# Patient Record
Sex: Female | Born: 1980 | Race: White | Hispanic: Yes | Marital: Married | Smoking: Never smoker
Health system: Southern US, Community
[De-identification: ages and names within clinical notes are randomized; demographics above are authoritative.]

## PROBLEM LIST (undated history)

## (undated) HISTORY — PX: CHOLECYSTECTOMY: SHX55

---

## 2003-05-06 ENCOUNTER — Encounter: Admission: RE | Admit: 2003-05-06 | Discharge: 2003-05-06 | Payer: Self-pay | Admitting: Obstetrics and Gynecology

## 2003-05-20 ENCOUNTER — Encounter: Admission: RE | Admit: 2003-05-20 | Discharge: 2003-05-20 | Payer: Self-pay | Admitting: Obstetrics and Gynecology

## 2003-12-22 ENCOUNTER — Inpatient Hospital Stay (HOSPITAL_COMMUNITY): Admission: AD | Admit: 2003-12-22 | Discharge: 2003-12-23 | Payer: Self-pay | Admitting: Family Medicine

## 2006-03-09 ENCOUNTER — Emergency Department (HOSPITAL_COMMUNITY): Admission: EM | Admit: 2006-03-09 | Discharge: 2006-03-10 | Payer: Self-pay | Admitting: Emergency Medicine

## 2007-03-12 ENCOUNTER — Ambulatory Visit (HOSPITAL_COMMUNITY): Admission: RE | Admit: 2007-03-12 | Discharge: 2007-03-12 | Payer: Self-pay | Admitting: Gynecology

## 2007-04-11 ENCOUNTER — Emergency Department (HOSPITAL_COMMUNITY): Admission: EM | Admit: 2007-04-11 | Discharge: 2007-04-11 | Payer: Self-pay | Admitting: Emergency Medicine

## 2007-05-22 ENCOUNTER — Ambulatory Visit (HOSPITAL_COMMUNITY): Admission: AD | Admit: 2007-05-22 | Discharge: 2007-05-25 | Payer: Self-pay | Admitting: Surgery

## 2007-05-22 ENCOUNTER — Encounter (INDEPENDENT_AMBULATORY_CARE_PROVIDER_SITE_OTHER): Payer: Self-pay | Admitting: Surgery

## 2007-05-28 ENCOUNTER — Ambulatory Visit: Payer: Self-pay | Admitting: Internal Medicine

## 2008-02-18 IMAGING — RF DG ERCP WO/W SPHINCTEROTOMY
1 series · 2 of 2 positions shown · non-contrast
Comparison: none

CLINICAL DATA: Gallstones

ERCP:

[Series 1: run · 2 of 2 slices shown]
[im 1/2]
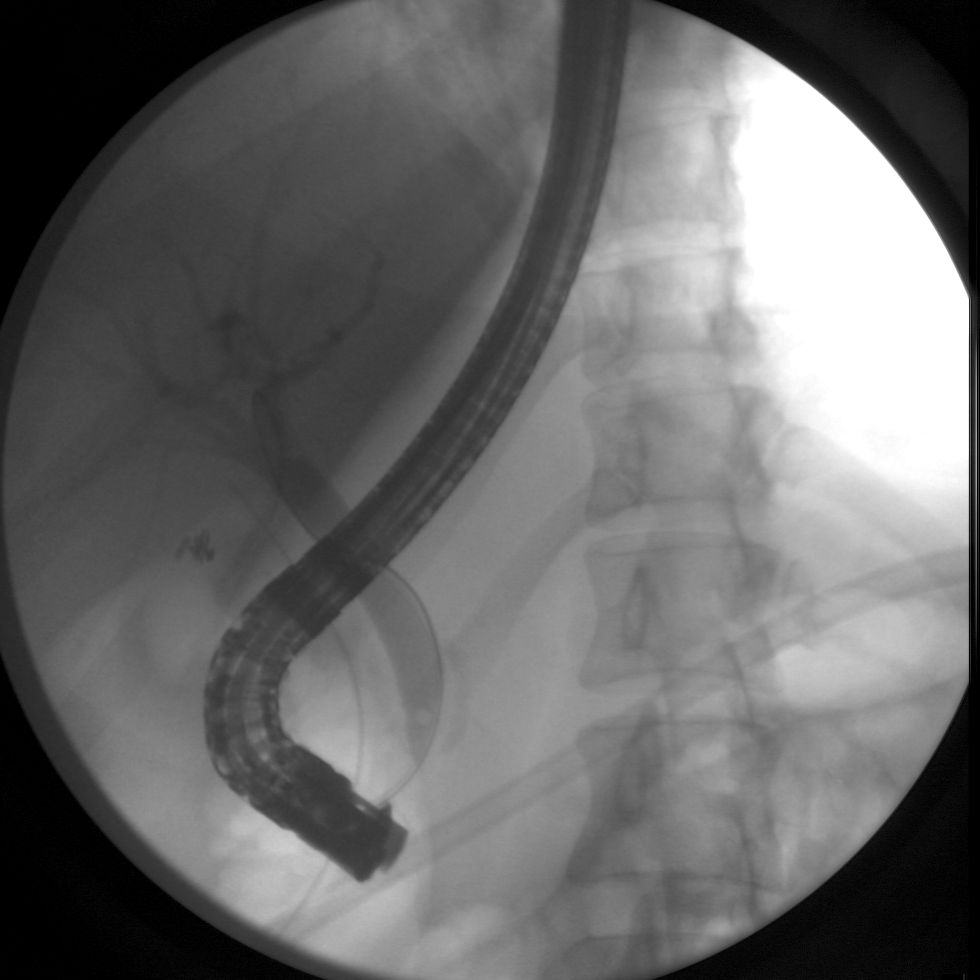
[im 2/2]
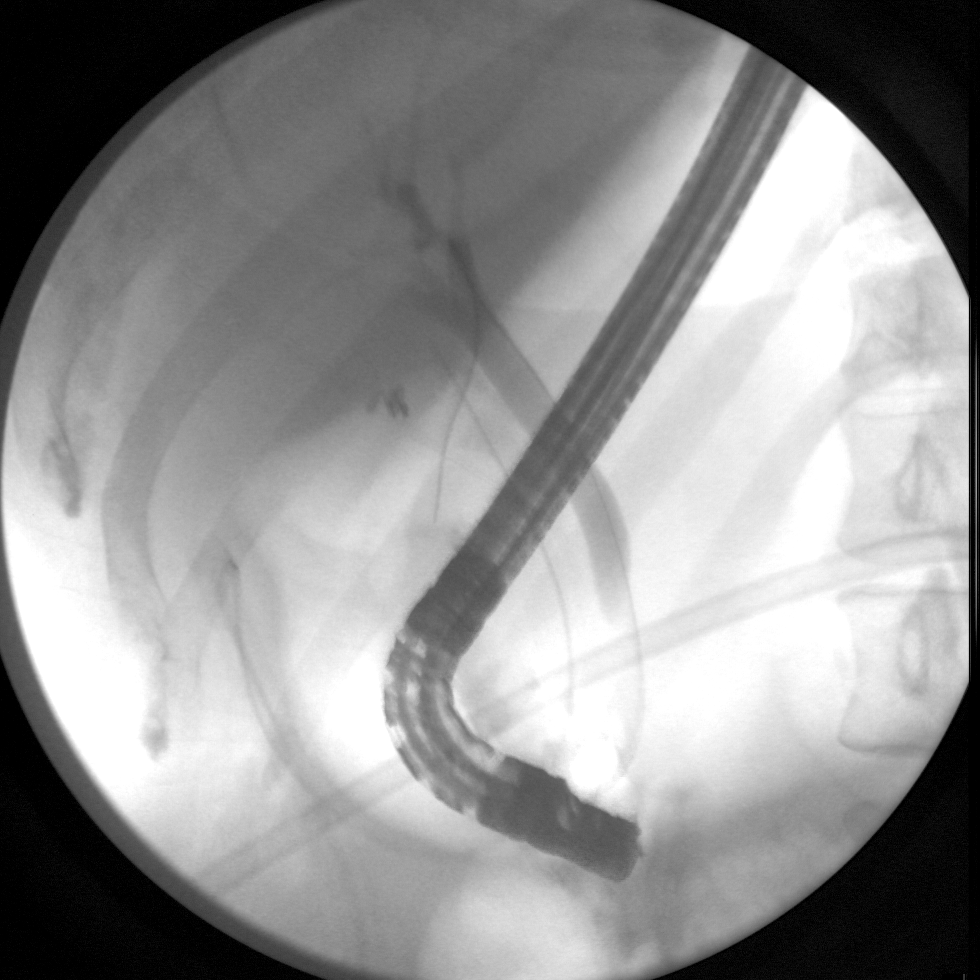

[2 of 2 positions shown; findings below may reference images not displayed]

FINDINGS: ERCP was performed without radiologist present. Two spot images are
submitted, showing opacified mildly dilated biliary system. On the first image,
there is a small filling defect in the distal common bile duct which may
represent retained stone or air bubble. Final image shows no definite filling
defect in the opacified portions of the biliary system.
IMPRESSION: ERCP as above.

## 2008-03-06 ENCOUNTER — Ambulatory Visit: Payer: Self-pay | Admitting: Gynecology

## 2008-03-14 ENCOUNTER — Ambulatory Visit: Payer: Self-pay | Admitting: Gynecology

## 2008-03-16 ENCOUNTER — Ambulatory Visit: Payer: Self-pay | Admitting: Gynecology

## 2008-03-31 ENCOUNTER — Ambulatory Visit: Payer: Self-pay | Admitting: Gynecology

## 2008-04-02 ENCOUNTER — Ambulatory Visit: Payer: Self-pay | Admitting: Gynecology

## 2008-04-08 ENCOUNTER — Ambulatory Visit: Payer: Self-pay | Admitting: Gynecology

## 2008-04-16 ENCOUNTER — Ambulatory Visit: Payer: Self-pay | Admitting: Gynecology

## 2008-04-24 ENCOUNTER — Inpatient Hospital Stay (HOSPITAL_COMMUNITY): Admission: AD | Admit: 2008-04-24 | Discharge: 2008-04-24 | Payer: Self-pay | Admitting: Obstetrics & Gynecology

## 2010-10-05 NOTE — Discharge Summary (Signed)
Natalie Landry, RAMBEAU                ACCOUNT NO.:  [id number]   MEDICAL RECORD NO.:  [id number]          PATIENT TYPE:  INP   LOCATION:  5152                         FACILITY:  [facility]   PHYSICIAN:  Thomas A. Cornett, M.D.DATE OF BIRTH:  1981/04/16   DATE OF ADMISSION:  05/22/2007  DATE OF DISCHARGE:  05/25/2007                               DISCHARGE SUMMARY   ADMITTING DIAGNOSES:  1. Symptomatic cholelithiasis.  2. Common bile duct stone.   DISCHARGE DIAGNOSES:  1. Symptomatic cholelithiasis.  2. Common bile duct stone.   PROCEDURES PERFORMED:  1. Laparoscopic cholecystectomy with intraoperative cholangiogram.  2. ERCP by Dr. Juanda Chance of [facility].   BRIEF HISTORY:  The patient is a 30 year old Hispanic female with  symptomatic cholelithiasis.  She is admitted on 05/22/2007 after  laparoscopic cholecystectomy due to common bile duct stones on  intraoperative cholangiogram.   HOSPITAL COURSE:  After undergoing laparoscopic cholecystectomy with  intraoperative cholangiogram on 05/22/2007, GI was consulted which  consisted of [facility] by Dr. Lina Sar.  The patient was seen and  ERCP was performed on 05/23/2007 with clearance of her duct due to  common duct stones.  Her liver function studies were elevated, but these  came down.  She felt much better after undergoing ERCP.  She had a drain  in place and this was removed on 05/25/2007.  The patient was doing well  at that point.  She was having some urinary frequency and I placed her  prophylactically on Cipro 500 mg p.o. b.i.d. for three days.  She will  be discharged home in an improved condition.  Her JP drain was removed  prior to discharge.   CONDITION AT DISCHARGE:  Improved.   DISCHARGE INSTRUCTIONS:  She will return in 3-4 weeks.  She may resume  work in 1-2 weeks, may shower at any time.  Her diet will be as  tolerated.  She will take Cipro 500 mg p.o. b.i.d. for three days.  She  will take Vicodin-ES 1-2 tabs every  four hours p.r.n. pain.      Thomas A. Cornett, M.D.  Electronically Signed     TAC/MEDQ  D:  05/25/2007  T:  05/25/2007  Job:  [id number]

## 2010-10-05 NOTE — Op Note (Signed)
Natalie Landry, Natalie Landry                ACCOUNT NO.:  [id number]   MEDICAL RECORD NO.:  [id number]          PATIENT TYPE:  OIB   LOCATION:  5152                         FACILITY:  [facility]   PHYSICIAN:  Thomas A. Cornett, M.D.DATE OF BIRTH:  02-May-1981   DATE OF PROCEDURE:  05/22/2007  DATE OF DISCHARGE:                               OPERATIVE REPORT   PREOPERATIVE DIAGNOSIS:  Symptomatic cholelithiasis.   POSTOPERATIVE DIAGNOSIS:  Symptomatic cholelithiasis plus  choledocholithiasis.   PROCEDURE:  Laparoscopic cholecystectomy with intraoperative  cholangiogram.   SURGEON:  Thomas A. Cornett, M.D.   ASSISTANT:  OR staff.   DRAINS:  19 Blake drain to gallbladder fossa.   SPECIMEN:  Gallbladder stone to pathology.   ESTIMATED BLOOD LOSS:  20 mL.   INDICATIONS FOR PROCEDURE:  The patient is a 30 year old Hispanic female  with cholelithiasis.  She presents for laparoscopic cholecystectomy  today due to symptomatic cholelithiasis.  Informed consent was obtained  via translator.   DESCRIPTION OF PROCEDURE:  The patient was brought to the operating room  and placed supine.  After induction of general endotracheal anesthesia,  the abdomen was prepped and draped in sterile fashion.   A 1-cm infraumbilical incision was made, and dissection was carried down  to her midline fascia, and her midline fascia was opened with the  scalpel.  Kochers were used to grasp the fascia, and I used a small  hemostat to spread open the peritoneal lining.  A pursestring suture of  0 Vicryl was placed, and a 12-mm Hassan cannula was placed under direct  vision.  Pneumoperitoneum was created with 15 mmHg of CO2, and the  laparoscope was then placed.  She was placed in reverse Trendelenburg  and rolled to her left.  Next, laparoscopy was performed.  I saw no  other significant intra-abdominal abnormality.  An 11-mm subxiphoid port  was placed under direct vision.  Two 5-mm ports were placed in the right  upper abdominal quadrant.  The dome of the gallbladder was grasped with  a grasper and pushed toward the patient's right shoulder.  A second  grasper was used to grab the infundibulum and pull it toward the  patient's right lower quadrant.  We used a dissection to dissect out the  infundibulum, and then we were able to expose the cystic duct as it  entered the gallbladder.  A clip was placed on the gallbladder side of  the cystic duct.  A small incision was made in the cystic duct.  Through  a separate stab incision, a Cook cholangiogram catheter was introduced,  and this was placed in the cystic duct.  Clips were then placed to hold  the catheter in the cystic duct.  Intraoperative cholangiogram was then  performed using one-half strength Hypaque dye.  She had a very tortuous  cystic duct that flowed into the common duct quite quickly.  There was  free flow of contrast up the common hepatic duct to the bifurcation.  Next, there was free flow of contrast down the common bile duct that  stopped at the ampulla, and there appeared to  be a meniscus at the  distal common duct.  1 mg of glucagon was given, but unfortunately the  common duct would not empty.  This appeared to be a common bile duct  stone, and I felt postop ERCP would be warranted.  At this point in  time, I removed the catheter.  We triple clipped the cystic duct and  divided it.  We found the cystic artery and controlled it between clips  and divided it.  We then used the cautery to dissect the gallbladder  from the gallbladder fossa.  One small hole was placed in the  gallbladder with spillage of bile, but no stones.  We were able to get  the gallbladder out of the gallbladder fossa and place it in an  EndoCatch bag.  We inspected the gallbladder bed.  There were two small  points where there was some oozing, and cautery was used on these.  We  then irrigated out any excess irrigation and bile.  I placed a 19 Blake  drain in  the gallbladder fossa through the most lateral port site, given  the fact that she has a common duct stone and if there was any leakage,  this would help.  At this point in time, I secured it with 3-0 nylon.  Excess irrigation was suctioned out.  We then removed the gallbladder  through the umbilical port and passed it off the field.  The umbilical  port was closed with a pursestring suture of Vicryl already in place.  The remainder of her ports were removed.  CO2 was allowed to escape  simultaneously.  We closed the skin incisions with 4-0 Monocryl.  Dermabond was applied.  All final counts of sponge, needle, and  instruments were found to be correct at this portion of the case.   The patient was then awakened and taken to recovery in satisfactory  condition.      Thomas A. Cornett, M.D.  Electronically Signed     TAC/MEDQ  D:  05/22/2007  T:  05/22/2007  Job:  [id number]

## 2010-10-08 NOTE — Group Therapy Note (Signed)
NAME:  Natalie, Landry                     ACCOUNT NO.:  [id number]   MEDICAL RECORD NO.:  [id number]                   PATIENT TYPE:  OUT   LOCATION:  [facility]                           FACILITY:  [facility]   PHYSICIAN:  Elsie Lincoln, MD                   DATE OF BIRTH:  1980-06-02   DATE OF SERVICE:  05/20/2003                                    CLINIC NOTE   CHIEF COMPLAINT:  Natalie Landry is here for follow-up of testing results for  oligomenorrhea and infertility.  The patient also is in need of a Pap smear  today.  She was here two weeks ago; however, she was having her  menstruation.  Test results were all negative.  Free testosterone was  negative, DHEAS was negative, 17-hydroxyprogesterone was negative.  She had  a two-hour GCT done.  Her fasting glucose was 99 which is upper limit of  normal.  The two-hour glucose is 106 and her fasting insulin was 5.  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   PHYSICAL EXAMINATION:  GENERAL:  Acne scars and acanthosis nigricans of her  neck and small increased female hair pattern.  PELVIC:  External genitalia:  Tanner V.  Vagina:  Pink, normal rugae.  At  this point the patient complains she has been having some discharge so wet  prep was done.  Mildly increased erythema, no odor with the discharge.  Uterus:  Small, anteverted, nontender.  Adnexa:  No masses, nontender.   ASSESSMENT AND PLAN:  A 30 year old female with irregular menses and  infertility.   1. Pap smear and cultures done today.  2. Wet prep sent for vaginitis.  3. The patient given results of sperm analysis and told that the patient's     husband needs to see a urologist to have further evaluation to see if     there is an anatomical, chromosomal, or other reason there is no sperm     present.  4. TSH and prolactin today.  5. Return to clinic in three weeks to discuss results.  The patient     questionably PSO given appearance, irregular menstruation, and  increased     fasting glucose.  May consider Glucophage next visit to see if she can     start menstruating.  If the patient does not desire this may consider     OCPs to give her regular menses.                                               Elsie Lincoln, MD    KL/MEDQ  D:  05/20/2003  T:  05/20/2003  Job:  [id number]

## 2010-10-08 NOTE — Group Therapy Note (Signed)
Natalie Landry, LEOPARD                     ACCOUNT NO.:  [id number]   MEDICAL RECORD NO.:  [id number]                   PATIENT TYPE:  OUT   LOCATION:  [facility]                           FACILITY:  [facility]   PHYSICIAN:  Elsie Lincoln, MD                   DATE OF BIRTH:  01-13-1981   DATE OF SERVICE:  05/06/2003                                    CLINIC NOTE   HISTORY OF PRESENT ILLNESS:  The patient is a 30 year old G0, P0 who  presents for Pap smear and for discussion of irregular menstrual periods.  She has been married for four years and has been trying to get pregnant and  has been unsuccessful.  Unable to time period secondary to irregular menses.  The patient has had irregular menses since she started menarche at age 73.  Sometimes they are every one to three months.  The patient does not know of  any children her husband has fathered.  The patient also desires Pap smear  today; however, she is menstruating.  Her LMP was May 01, 2003 and she  is bleeding too heavily to do this today.   GYN HISTORY:  No sexually transmitted diseases or abnormal Pap smears,  fibroid tumors of the uterus, or ovarian cysts.   PAST MEDICAL HISTORY:  Denies.   PAST SURGICAL HISTORY:  Had umbilical hernia repair as an infant.   ALLERGIES:  None.   MEDICATIONS:  None.   REVIEW OF SYSTEMS:  No chest pain, shortness of breath, nausea, vomiting,  diarrhea, change in bowel or bladder habits.   PHYSICAL EXAMINATION:  SKIN:  It looks like there is acanthosis nigricans  along the neck.  Evidence of old acne scars on her face and mild hirsutism  on the chin, upper lip, and periumbilical area.  PELVIC:  External genitalia is Tanner 5.  Vagina:  Blood in the vault.  Unable to do Pap smear.   ASSESSMENT/PLAN:  A 30 year old G0 female with oligomenorrhea and needing a  Pap smear.  1. Pap smear in two weeks.  2. Will test for PCO.  This includes two hour glucose challenge test with a     fasting  insulin, fasting glucose, and a two hour glucose.  3. DHEAS, free testosterone, and 17-hydroxyprogesterone.  4. Return to clinic in two weeks for test results.  5. The patient to get sperm analysis.                                               Elsie Lincoln, MD   KL/MEDQ  D:  05/06/2003  T:  05/06/2003  Job:  [id number]

## 2011-02-10 LAB — COMPREHENSIVE METABOLIC PANEL WITH GFR
ALT: 436 — ABNORMAL HIGH
AST: 209 — ABNORMAL HIGH
Albumin: 3 — ABNORMAL LOW
Alkaline Phosphatase: 135 — ABNORMAL HIGH
BUN: 3 — ABNORMAL LOW
CO2: 31
Calcium: 8.8
Chloride: 105
Creatinine, Ser: 0.35 — ABNORMAL LOW
GFR calc Af Amer: >60
GFR calc non Af Amer: >60
Glucose, Bld: 115 — ABNORMAL HIGH
Potassium: 3.6
Sodium: 139
Total Bilirubin: 1.1
Total Protein: 5.7 — ABNORMAL LOW

## 2011-02-10 LAB — CBC
MCHC: 34.5
WBC: 6.5

## 2011-02-10 LAB — LIPASE, BLOOD: Lipase: 49

## 2011-02-10 LAB — AMYLASE: Amylase: 93

## 2011-02-25 LAB — COMPREHENSIVE METABOLIC PANEL
ALT: 18
AST: 19
Albumin: 4.3
Alkaline Phosphatase: 58
BUN: 7
CO2: 25
Calcium: 9
Creatinine, Ser: 0.48
GFR calc Af Amer: >60
GFR calc non Af Amer: >60
GFR calc non Af Amer: >60
Glucose, Bld: 154 — ABNORMAL HIGH
Potassium: 4

## 2011-02-25 LAB — URINALYSIS, ROUTINE W REFLEX MICROSCOPIC
Ketones, ur: 15 mg/dL — AB
Leukocytes, UA: NEGATIVE
Nitrite: NEGATIVE
Protein, ur: NEGATIVE mg/dL
Specific Gravity, Urine: >1.03 — ABNORMAL HIGH (ref 1.005–1.030)
Urobilinogen, UA: 0.2 mg/dL (ref 0.0–1.0)

## 2011-02-25 LAB — CBC
Hemoglobin: 12.1
MCHC: 33.4
MCHC: 33.8
MCV: 88.2
MCV: 88.4
Platelets: 351
RBC: 4.05
RDW: 13.4
WBC: 9.3

## 2011-02-25 LAB — HEPATIC FUNCTION PANEL
Albumin: 4.5
Alkaline Phosphatase: 122 — ABNORMAL HIGH
Bilirubin, Direct: 0.9 — ABNORMAL HIGH
Indirect Bilirubin: 0.9
Total Bilirubin: 1.8 — ABNORMAL HIGH
Total Protein: 7.8

## 2011-02-25 LAB — URINE MICROSCOPIC-ADD ON

## 2011-02-25 LAB — DIFFERENTIAL
Basophils Absolute: 0.1
Lymphocytes Relative: 32
Lymphs Abs: 3
Neutro Abs: 5.6
Neutrophils Relative %: 60

## 2011-03-01 LAB — CBC
MCHC: 34.6
RBC: 4.26
WBC: 7.6

## 2011-03-01 LAB — DIFFERENTIAL
Eosinophils Absolute: 0.1 — ABNORMAL LOW
Eosinophils Relative: 1
Lymphs Abs: 1.9
Monocytes Absolute: 0.5
Monocytes Relative: 6

## 2011-03-01 LAB — COMPREHENSIVE METABOLIC PANEL
ALT: 18
AST: 22
Albumin: 3.8
CO2: 25
Calcium: 9
GFR calc Af Amer: >60
GFR calc non Af Amer: >60
Sodium: 137
Total Protein: 7.1

## 2011-03-01 LAB — PREGNANCY, URINE: Preg Test, Ur: NEGATIVE

## 2011-03-01 LAB — URINALYSIS, ROUTINE W REFLEX MICROSCOPIC
Bilirubin Urine: NEGATIVE
Nitrite: NEGATIVE
Specific Gravity, Urine: 1.03
Urobilinogen, UA: 0.2

## 2011-03-01 LAB — LIPASE, BLOOD: Lipase: 24

## 2013-07-15 ENCOUNTER — Encounter (HOSPITAL_COMMUNITY): Payer: Self-pay | Admitting: Emergency Medicine

## 2013-07-15 DIAGNOSIS — Z3202 Encounter for pregnancy test, result negative: Secondary | ICD-10-CM | POA: Insufficient documentation

## 2013-07-15 DIAGNOSIS — Z9089 Acquired absence of other organs: Secondary | ICD-10-CM | POA: Insufficient documentation

## 2013-07-15 DIAGNOSIS — R1032 Left lower quadrant pain: Secondary | ICD-10-CM | POA: Insufficient documentation

## 2013-07-15 LAB — COMPREHENSIVE METABOLIC PANEL
ALBUMIN: 4 g/dL (ref 3.5–5.2)
ALT: 22 U/L (ref 0–35)
AST: 16 U/L (ref 0–37)
Alkaline Phosphatase: 69 U/L (ref 39–117)
BUN: 16 mg/dL (ref 6–23)
CALCIUM: 9.8 mg/dL (ref 8.4–10.5)
CO2: 27 mEq/L (ref 19–32)
CREATININE: 0.67 mg/dL (ref 0.50–1.10)
Chloride: 101 mEq/L (ref 96–112)
GFR calc Af Amer: >90 mL/min (ref >90)
Glucose, Bld: 105 mg/dL — ABNORMAL HIGH (ref 70–99)
Potassium: 4.1 mEq/L (ref 3.7–5.3)
Sodium: 142 mEq/L (ref 137–147)
Total Bilirubin: 0.2 mg/dL — ABNORMAL LOW (ref 0.3–1.2)
Total Protein: 8 g/dL (ref 6.0–8.3)

## 2013-07-15 LAB — URINE MICROSCOPIC-ADD ON

## 2013-07-15 LAB — CBC WITH DIFFERENTIAL/PLATELET
BASOS ABS: 0 10*3/uL (ref 0.0–0.1)
BASOS PCT: 0 % (ref 0–1)
EOS PCT: 2 % (ref 0–5)
Eosinophils Absolute: 0.2 10*3/uL (ref 0.0–0.7)
HEMATOCRIT: 38.2 % (ref 36.0–46.0)
Hemoglobin: 13.1 g/dL (ref 12.0–15.0)
Lymphocytes Relative: 36 % (ref 12–46)
Lymphs Abs: 3.8 10*3/uL (ref 0.7–4.0)
MCH: 30.1 pg (ref 26.0–34.0)
MCHC: 34.3 g/dL (ref 30.0–36.0)
MCV: 87.8 fL (ref 78.0–100.0)
MONO ABS: 0.9 10*3/uL (ref 0.1–1.0)
MONOS PCT: 9 % (ref 3–12)
Neutro Abs: 5.8 10*3/uL (ref 1.7–7.7)
Neutrophils Relative %: 54 % (ref 43–77)
Platelets: 369 10*3/uL (ref 150–400)
RBC: 4.35 MIL/uL (ref 3.87–5.11)
RDW: 13.1 % (ref 11.5–15.5)
WBC: 10.6 10*3/uL — ABNORMAL HIGH (ref 4.0–10.5)

## 2013-07-15 LAB — URINALYSIS, ROUTINE W REFLEX MICROSCOPIC
Bilirubin Urine: NEGATIVE
GLUCOSE, UA: NEGATIVE mg/dL
Ketones, ur: NEGATIVE mg/dL
Nitrite: NEGATIVE
PROTEIN: NEGATIVE mg/dL
SPECIFIC GRAVITY, URINE: 1.015 (ref 1.005–1.030)
UROBILINOGEN UA: 0.2 mg/dL (ref 0.0–1.0)
pH: 5.5 (ref 5.0–8.0)

## 2013-07-15 LAB — POC URINE PREG, ED: PREG TEST UR: NEGATIVE

## 2013-07-15 NOTE — ED Notes (Signed)
Patient presents with c/o abd pain that started about 1 hour ago.  Abd gets tight at times and that comes and goes.  Denies missing any menstrual cycles.  Pain in the abd and back

## 2013-07-16 ENCOUNTER — Emergency Department (HOSPITAL_COMMUNITY): Payer: Self-pay

## 2013-07-16 ENCOUNTER — Emergency Department (HOSPITAL_COMMUNITY)
Admission: EM | Admit: 2013-07-16 | Discharge: 2013-07-16 | Disposition: A | Discharge status: Home / Self Care | Payer: Self-pay | Attending: Emergency Medicine | Admitting: Emergency Medicine

## 2013-07-16 ENCOUNTER — Encounter (HOSPITAL_COMMUNITY): Payer: Self-pay | Admitting: Radiology

## 2013-07-16 DIAGNOSIS — R109 Unspecified abdominal pain: Secondary | ICD-10-CM

## 2013-07-16 LAB — LIPASE, BLOOD: LIPASE: 42 U/L (ref 11–59)

## 2013-07-16 MED ORDER — IOHEXOL 300 MG/ML  SOLN
100.0000 mL | Freq: Once | INTRAMUSCULAR | Status: AC | PRN
  Administered 2013-07-16: 100 mL via INTRAVENOUS

## 2013-07-16 MED ORDER — NAPROXEN 500 MG PO TABS: 500.0000 mg | ORAL_TABLET | Freq: Two times a day (BID) | ORAL | Status: AC

## 2013-07-16 MED ORDER — TRAMADOL HCL 50 MG PO TABS: 50.0000 mg | ORAL_TABLET | Freq: Four times a day (QID) | ORAL | Status: AC | PRN

## 2013-07-16 MED ORDER — MORPHINE SULFATE 4 MG/ML IJ SOLN
4.0000 mg | Freq: Once | INTRAMUSCULAR | Status: AC
  Administered 2013-07-16: 4 mg via INTRAVENOUS
  Filled 2013-07-16: qty 1

## 2013-07-16 MED ORDER — SODIUM CHLORIDE 0.9 % IV SOLN
Freq: Once | INTRAVENOUS | Status: AC
  Administered 2013-07-16: 04:00:00 via INTRAVENOUS

## 2013-07-16 MED ORDER — OXYCODONE-ACETAMINOPHEN 5-325 MG PO TABS
2.0000 | ORAL_TABLET | Freq: Once | ORAL | Status: AC
  Administered 2013-07-16: 2 via ORAL
  Filled 2013-07-16: qty 2

## 2013-07-16 MED ORDER — ONDANSETRON HCL 4 MG/2ML IJ SOLN
4.0000 mg | Freq: Once | INTRAMUSCULAR | Status: AC
  Administered 2013-07-16: 4 mg via INTRAVENOUS
  Filled 2013-07-16: qty 2

## 2013-07-16 NOTE — Discharge Instructions (Signed)
Your CT scan is normal - use the medicines as prescribed.  Please call your doctor for a followup appointment within 24-48 hours. When you talk to your doctor please let them know that you were seen in the emergency department and have them acquire all of your records so that they can discuss the findings with you and formulate a treatment plan to fully care for your new and ongoing problems.   Emergency Department Resource Guide 1) Find a Doctor and Pay Out of Pocket Although you won't have to find out who is covered by your insurance plan, it is a good idea to ask around and get recommendations. You will then need to call the office and see if the doctor you have chosen will accept you as a new patient and what types of options they offer for patients who are self-pay. Some doctors offer discounts or will set up payment plans for their patients who do not have insurance, but you will need to ask so you aren't surprised when you get to your appointment.  2) Contact Your Local Health Department Not all health departments have doctors that can see patients for sick visits, but many do, so it is worth a call to see if yours does. If you don't know where your local health department is, you can check in your phone book. The CDC also has a tool to help you locate your state's health department, and many state websites also have listings of all of their local health departments.  3) Find a Walk-in Clinic If your illness is not likely to be very severe or complicated, you may want to try a walk in clinic. These are popping up all over the country in pharmacies, drugstores, and shopping centers. They're usually staffed by nurse practitioners or physician assistants that have been trained to treat common illnesses and complaints. They're usually fairly quick and inexpensive. However, if you have serious medical issues or chronic medical problems, these are probably not your best option.  No Primary Care  Doctor: - Call Health Connect at  [phone] - they can help you locate a primary care doctor that  accepts your insurance, provides certain services, etc. - Physician Referral Service- [phone]  Chronic Pain Problems: Organization         Address  Phone   Notes  [facility]  [phone] Patients need to be referred by their primary care doctor.   Medication Assistance: Organization         Address  Phone   Notes  [facility] [address] [place], [place] [address] [phone] --Must be a resident of [facility] -- Must have NO insurance coverage whatsoever (no Medicaid/ Medicare, etc.) -- The pt. MUST have a primary care doctor that directs their care regularly and follows them in the community   MedAssist  [phone]   [employer]  [phone]    Agencies that provide inexpensive medical care: Organization         Address  Phone   Notes  [facility]  [phone]   [facility]    [phone]   [facility] [address] [place], [place] [address] [phone]   [facility] [address], [place] [phone]   Planned Parenthood    [phone]   [facility]    [phone]   Community Health  and [address], [place] Phone:  [phone], Fax:  [phone] Hours of Operation:  9 am - 6 pm, M-F.  Also accepts Medicaid/Medicare and self-pay.  [facility] for [place], [address], [place] Phone: [phone], Fax: [phone]. Hours of Operation:  8:30 am - 5:30 pm, M-F.  Also accepts Medicaid and self-pay.  [facility] [place] [address] [place] Phone: [phone]   [place], [place], [place] [phone], Ext. [phone] Mondays & Thursdays: 7-9 AM.  First 15 patients are seen on a first  come, first serve basis.    [place] Providers:  Organization         Address  Phone   Notes  [facility] [address], [place] [phone] Also accepts self-pay patients.  [facility] [address], [place]  [phone]   [place], [address], [place] [phone]   [facility] [address] [place] [phone]   Lucianne Lei [address], [place]   [phone] Only accepts [place] Access [place] patients after they have their name applied to their card.   Self-Pay (no insurance) in [facility]:  Organization         Address  Phone   Notes  Sickle Cell Patients, [facility] [place] [phone]   [facility] [place] [phone]   [facility] [place]  [place], [place], [place] [phone]   [facility]/Dr. Osei-Bonsu  [address] [place] or [address], [place] [phone] Phone number for both [place] and [place] locations is the same.  [facility] [address] [place] [phone]   [facility] [address], [place] or [address] [phone]   [facility] [address], [place] [phone], phone; [phone], fax Sees patients 1st and 3rd Saturday of every month.  Must not qualify for public or private insurance (i.e. Medicaid, Medicare, [place] Health Choice, Veterans' Benefits)  Household income should be no more than 200% of the poverty level The clinic cannot treat you if you are pregnant or think you are pregnant  Sexually transmitted diseases are not treated at the clinic.    Dental Care: Organization          Address  Phone  Notes  [facility] [place] [phone] Accepts children up to age 79 who are enrolled in [place] or [place]; pregnant women with a Medicaid card; and children who have applied for Medicaid or [place] Health Choice, but were declined, whose parents can pay a reduced fee at time of service.  [facility] Department of [facility]  [address], [place] [phone] Accepts children up to age 42 who are enrolled in [place] or [place]; pregnant women with a Medicaid card; and children who have applied for Medicaid or [place] Health Choice, but were declined, whose parents can pay a reduced fee at time of service.  [facility]  [address]  Mardene Speak [phone] Patients are seen by appointment only. Walk-ins are not accepted. [place] will see patients 60 years of age and older. Monday - Tuesday (8am-5pm) Most Wednesdays (8:30-5pm) $30 per visit, cash only  [facility] Adult [facility] Access PROGRAM  [address], [facility] [phone] Patients are seen by appointment only. Walk-ins are not accepted. Clarissa will see patients 9 years of age and older. One Wednesday Evening (Monthly: Volunteer Based).  $30 per visit, cash only  [place]  [phone] for adults; Children under age 8, call Graduate Pediatric Dentistry at [phone]. Children aged 87-14, please call [phone] to request a pediatric application.  Dental services are provided in all areas of dental care including fillings, crowns and bridges, complete and partial dentures, implants, gum treatment, root canals, and extractions. Preventive care is also provided. Treatment is provided to both adults and children. Patients are selected via a lottery and there is often a waiting list.   [facility] [address], [place]  [phone] [email or web address]   [facility] [address] [place], [place] [phone], Ext. [phone] Second and Fourth Thursday of each month, opens at 6:30 AM; Clinic ends at 9 AM.  Patients are seen on a first-come first-served basis, and a limited number are seen during each clinic.   [facility]  [address] Hillard Danker [place], [place] [phone]   Eligibility Requirements You must have lived in [place], [place], or [place] counties for at least the last three months.   You cannot be eligible for state or federal sponsored [employer], including [employer], [place], or [employer].   You generally cannot be eligible for healthcare insurance through your employer.    How to apply: Eligibility screenings are held every Tuesday and Wednesday afternoon from 1:00 pm until 4:00 pm. You do not need an appointment for the interview!  [facility] [address] [place], [place]   [place]  [place] Department  [place]  [phone]    Behavioral Health Resources in the Community: Intensive Outpatient Programs Organization         Address  Phone  Notes  [facility]. [address], [place], [place] [phone]   [facility] Outpatient [address], [place], [place]   ADS: Alcohol & Drug Svcs [address], [place], [place]   [place] [address]  [place], [place] or [phone]   Substance Abuse Resources Organization         Address  Phone  Notes  Alcohol and Drug Services  [phone]   [place]  [phone]   [facility] [place]   Chinita Pester  [phone]   Residential & Outpatient Substance Abuse Program  [phone]   Psychological  Services Organization         Address  Phone  Notes  [facility]  [place]  [phone]   [place] [address] [place] or [phone]    Mobile Crisis Teams Organization         Address  Phone  Notes  [facility], Mobile Crisis Care Unit  [phone]   Assertive Psychotherapeutic Services  [address] [place], [place]   [place] Levels  [place] [phone]    Self-Help/Support Groups Organization         Address  Phone             Notes  [facility] of [place] - variety of support groups  [place] Call for more information  Narcotics Anonymous (NA), [facility]aring [facility] [address], [employer] [place]  2 meetings at this location   Special educational needs teacher         Address  Phone  Notes  [facility] Residential Treatment [place],    [place]  [phone]   [facility]  [address] [place] [id number], [employer], [place]   [place], [place] [phone] Admissions: 8am-3pm M-F  Incentives Substance [place] [address],    [place], [place] [phone]   [facility] [address] [place], [place], [place]   [facility] [address],  [place], [place]   [facility] - Intensive Outpatient [address], [place], [place]   [facility] [place] ([place].) [address].,  [place], [place] [phone] or [phone]   [facility]idential Treatment Services (RTS) [address] [place], [place] Accepts Medicaid  [facility] [place] [address]  [place] [phone] Substance Abuse/Addiction Treatment   [facility] Organization         Address  Phone  Notes  [facility]  [phone]   Domenic Schwab, PhD [address] Arlis [place], [place]   [phone] or [phone]   [place], [place] [phone]   [facility] [address] [place], [place] [phone] Insurance/Medicaid/sponsorship through [facility] and Families [address]                                    [place], [place] [phone] [place] [address], [place] [phone]    Dr. Adele Schilder  [phone]   [facility] Dept. 1) [address], [place] 2) [place] 3)  [place] [address], [address] [phone]  [phone]   [place] [phone] or [phone] (After Hours)

## 2013-07-16 NOTE — ED Notes (Signed)
Patient transported to CT 

## 2013-07-16 NOTE — ED Provider Notes (Signed)
CSN: [id number]     Arrival date & time 07/15/13  2048 History   First MD Initiated Contact with Patient 07/16/13 [phone]     Chief Complaint  Patient presents with  . Abdominal Pain     (Consider location/radiation/quality/duration/timing/severity/associated sxs/prior Treatment) HPI Comments: 33 year old female, history of cholecystectomy and cesarean sections who present with a complaint of abdominal pain. This is located in the left upper quadrant, left lower quadrant and suprapubic area. The patient states that her abdomen was distended and the pain was severe, it does seem to come and go, there is no nausea vomiting or diarrhea. She has no coughing or shortness of breath, no fevers or chills, no blood in her stools. She has not had these symptoms in the past, they did start acutely approximately 6 hours prior to my evaluation.  Patient is a 33 y.o. female presenting with abdominal pain. The history is provided by the patient and the spouse.  Abdominal Pain   History reviewed. No pertinent past medical history. Past Surgical History  Procedure Laterality Date  . Cholecystectomy    . Cesarean section     History reviewed. No pertinent family history. History  Substance Use Topics  . Smoking status: Never Smoker   . Smokeless tobacco: Not on file  . Alcohol Use: No   OB History   Grav Para Term Preterm Abortions TAB SAB Ect Mult Living                 Review of Systems  Gastrointestinal: Positive for abdominal pain.  All other systems reviewed and are negative.      Allergies  Review of patient's allergies indicates no known allergies.  Home Medications   Current Outpatient Rx  Name  Route  Sig  Dispense  Refill  . OVER THE COUNTER MEDICATION   Oral   Take 2 tablets by mouth daily as needed (headache).         . naproxen (NAPROSYN) 500 MG tablet   Oral   Take 1 tablet (500 mg total) by mouth 2 (two) times daily with a meal.   30 tablet   0   . traMADol  (ULTRAM) 50 MG tablet   Oral   Take 1 tablet (50 mg total) by mouth every 6 (six) hours as needed.   15 tablet   0    BP 96/62  Pulse 70  Temp(Src) 98.3 F (36.8 C) (Oral)  Resp 13  Ht 5\' 3"  (1.6 m)  Wt 173 lb (78.472 kg)  BMI 30.65 kg/m2  SpO2 100%  LMP 07/06/2013 Physical Exam  Nursing note and vitals reviewed. Constitutional: She appears well-developed and well-nourished. No distress.  HENT:  Head: Normocephalic and atraumatic.  Mouth/Throat: Oropharynx is clear and moist. No oropharyngeal exudate.  Eyes: Conjunctivae and EOM are normal. Pupils are equal, round, and reactive to light. Right eye exhibits no discharge. Left eye exhibits no discharge. No scleral icterus.  Neck: Normal range of motion. Neck supple. No JVD present. No thyromegaly present.  Cardiovascular: Normal rate, regular rhythm, normal heart sounds and intact distal pulses.  Exam reveals no gallop and no friction rub.   No murmur heard. Pulmonary/Chest: Effort normal and breath sounds normal. No respiratory distress. She has no wheezes. She has no rales.  Abdominal: Soft. Bowel sounds are normal. She exhibits no distension and no mass. There is tenderness ( Soft abdomen but significantly tender to the left upper, left lower and suprapubic regions. No right lower quadrant tenderness,  no masses, no tympanitic sounds to percussion).  Musculoskeletal: Normal range of motion. She exhibits no edema and no tenderness.  Lymphadenopathy:    She has no cervical adenopathy.  Neurological: She is alert. Coordination normal.  Skin: Skin is warm and dry. No rash noted. No erythema.  Psychiatric: She has a normal mood and affect. Her behavior is normal.    ED Course  Procedures (including critical care time) Labs Review Labs Reviewed  CBC WITH DIFFERENTIAL - Abnormal; Notable for the following:    WBC 10.6 (*)    All other components within normal limits  COMPREHENSIVE METABOLIC PANEL - Abnormal; Notable for the  following:    Glucose, Bld 105 (*)    Total Bilirubin 0.2 (*)    All other components within normal limits  URINALYSIS, ROUTINE W REFLEX MICROSCOPIC - Abnormal; Notable for the following:    Hgb urine dipstick SMALL (*)    Leukocytes, UA TRACE (*)    All other components within normal limits  URINE MICROSCOPIC-ADD ON  LIPASE, BLOOD  POC URINE PREG, ED   Imaging Review Ct Abdomen Pelvis W Contrast  07/16/2013   CLINICAL DATA:  Abdominal pain.  Back pain.  EXAM: CT ABDOMEN AND PELVIS WITH CONTRAST  TECHNIQUE: Multidetector CT imaging of the abdomen and pelvis was performed using the standard protocol following bolus administration of intravenous contrast.  CONTRAST:  100mL OMNIPAQUE IOHEXOL 300 MG/ML  SOLN  COMPARISON:  Pelvic ultrasound performed 04/24/2008  FINDINGS: The visualized lung bases are clear.  The liver and spleen are unremarkable in appearance. The patient is status post cholecystectomy, with clips noted along the gallbladder fossa. The pancreas and adrenal glands are unremarkable.  The kidneys are unremarkable in appearance. There is no evidence of hydronephrosis. No renal or ureteral stones are seen. No perinephric stranding is appreciated.  No free fluid is identified. The small bowel is unremarkable in appearance. The stomach is within normal limits. No acute vascular abnormalities are seen.  The appendix is normal in caliber and contains air, without evidence for appendicitis. The colon is unremarkable in appearance.  The bladder is mildly distended and grossly unremarkable. The uterus is within normal limits. The ovaries are relatively symmetric; no suspicious adnexal masses are seen. No inguinal lymphadenopathy is seen.  No acute osseous abnormalities are identified.  IMPRESSION: Unremarkable contrast-enhanced CT of the abdomen and pelvis.   Electronically Signed   By: Roanna RaiderJeffery  Chang M.D.   On: 07/16/2013 05:36    EKG Interpretation   None       MDM   Final diagnoses:   Abdominal pain    The patient has normal vital signs, labs are reassuring including minimal leukocytosis, normal renal function, normal liver function and clean urinalysis. She is not pregnant, she will need imaging to rule out diverticulitis or other bowel abnormality causing significant pain.  Repeat abdominal exam with minimal left-sided tenderness, no guarding.  CT scan reveals no significant acute findings, lipase added, anticipate discharge.  Meds given in ED:  Medications  0.9 %  sodium chloride infusion ( Intravenous Stopped 07/16/13 0623)  morphine 4 MG/ML injection 4 mg (4 mg Intravenous Given 07/16/13 0422)  ondansetron (ZOFRAN) injection 4 mg (4 mg Intravenous Given 07/16/13 0422)  iohexol (OMNIPAQUE) 300 MG/ML solution 100 mL (100 mLs Intravenous Contrast Given 07/16/13 0455)  oxyCODONE-acetaminophen (PERCOCET/ROXICET) 5-325 MG per tablet 2 tablet (2 tablets Oral Given 07/16/13 0611)    New Prescriptions   NAPROXEN (NAPROSYN) 500 MG TABLET  Take 1 tablet (500 mg total) by mouth 2 (two) times daily with a meal.   TRAMADOL (ULTRAM) 50 MG TABLET    Take 1 tablet (50 mg total) by mouth every 6 (six) hours as needed.       Vida Roller, MD 07/16/13 [phone]

## 2013-07-16 NOTE — ED Notes (Signed)
Spoke with patient using tele interpretor line and explained about IV and medications for pain.  Reassess pain and vitals

## 2013-07-16 NOTE — ED Notes (Signed)
[employer] interpretors used for patient communication throughout visit.

## 2014-10-27 ENCOUNTER — Encounter (HOSPITAL_COMMUNITY): Payer: Self-pay | Admitting: Emergency Medicine

## 2014-10-27 ENCOUNTER — Emergency Department (HOSPITAL_COMMUNITY)
Admission: EM | Admit: 2014-10-27 | Discharge: 2014-10-27 | Disposition: A | Discharge status: Home / Self Care | Payer: Self-pay | Attending: Emergency Medicine | Admitting: Emergency Medicine

## 2014-10-27 ENCOUNTER — Emergency Department (HOSPITAL_COMMUNITY): Payer: Self-pay

## 2014-10-27 DIAGNOSIS — Y998 Other external cause status: Secondary | ICD-10-CM | POA: Insufficient documentation

## 2014-10-27 DIAGNOSIS — S199XXA Unspecified injury of neck, initial encounter: Secondary | ICD-10-CM | POA: Insufficient documentation

## 2014-10-27 DIAGNOSIS — Y9389 Activity, other specified: Secondary | ICD-10-CM | POA: Insufficient documentation

## 2014-10-27 DIAGNOSIS — S6992XA Unspecified injury of left wrist, hand and finger(s), initial encounter: Secondary | ICD-10-CM | POA: Insufficient documentation

## 2014-10-27 DIAGNOSIS — Z791 Long term (current) use of non-steroidal anti-inflammatories (NSAID): Secondary | ICD-10-CM | POA: Insufficient documentation

## 2014-10-27 DIAGNOSIS — S299XXA Unspecified injury of thorax, initial encounter: Secondary | ICD-10-CM | POA: Insufficient documentation

## 2014-10-27 DIAGNOSIS — S4992XA Unspecified injury of left shoulder and upper arm, initial encounter: Secondary | ICD-10-CM | POA: Insufficient documentation

## 2014-10-27 DIAGNOSIS — Y92410 Unspecified street and highway as the place of occurrence of the external cause: Secondary | ICD-10-CM | POA: Insufficient documentation

## 2014-10-27 MED ORDER — HYDROCODONE-ACETAMINOPHEN 5-325 MG PO TABS: 1.0000 | ORAL_TABLET | Freq: Four times a day (QID) | ORAL | Status: AC | PRN

## 2014-10-27 MED ORDER — HYDROCODONE-ACETAMINOPHEN 5-325 MG PO TABS
1.0000 | ORAL_TABLET | Freq: Once | ORAL | Status: AC
  Administered 2014-10-27: 1 via ORAL
  Filled 2014-10-27: qty 1
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
## 2014-10-27 NOTE — ED Notes (Signed)
Declined W/C at D/C and was escorted to lobby by RN. 
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
## 2016-09-20 ENCOUNTER — Ambulatory Visit (INDEPENDENT_AMBULATORY_CARE_PROVIDER_SITE_OTHER): Payer: Self-pay | Admitting: Licensed Clinical Social Worker

## 2016-09-20 DIAGNOSIS — Z638 Other specified problems related to primary support group: Secondary | ICD-10-CM
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
## 2016-10-05 ENCOUNTER — Ambulatory Visit (INDEPENDENT_AMBULATORY_CARE_PROVIDER_SITE_OTHER): Payer: Self-pay | Admitting: Licensed Clinical Social Worker

## 2016-10-05 DIAGNOSIS — Z638 Other specified problems related to primary support group: Secondary | ICD-10-CM

## 2016-10-07 NOTE — Progress Notes (Signed)
   THERAPY PROGRESS NOTE  Session Time: 45min  Participation Level: Active  Behavioral Response: CasualAlertEuthymic  Type of Therapy: Individual Therapy  Treatment Goals addressed: Coping  Interventions: Motivational Interviewing and Supportive  Summary: Natalie Landry is a 36 y.o. female who presents with a euthymic mood and appropriate affect. She reported that she has been feeling much better after the past few weeks because she had implemented the self-care strategies discussed in last session. She shared that she is walking more and doing more coloring/painting. She shared that she at times will leave her children at home with her husband so that she can run errands on her own, which she previously did not do. She expressed that she understands now that she has to prioritize her own health and well-being in order to be the kind of mother that she wants to be. She denied any depression symptoms or anxiety symptoms. She shared that she occasionally has "episodes" that seem to be related to a high stress level, when she feels hot, feels like she can't breathe, and her chest hurts. She reported that these episodes last up to 30 minutes but she is able to calm herself down. She and LCSW reviewed other strategies that she can use to calm herself. Keili shared that she does not feel that she needs further counseling at this time but will call LCSW in the future if needed.   Suicidal/Homicidal: Nowithout intent/plan  Therapist Response: LCSW utilized supportive counseling techniques throughout the session in order to validate emotions and encourage open expression of emotion. LCSW and Elaiza processed about how important self-care is for her health. LCSW and Britnee reviewed different relaxation techniques she can use when she is stressed. LCSW encouraged her to return to counseling anytime in the future if needed.  Plan: Return again in 0 weeks.    Nilda Simmeratosha Vasil Juhasz, LCSW 10/07/2016

## 2020-03-21 ENCOUNTER — Encounter (HOSPITAL_COMMUNITY): Payer: Self-pay | Admitting: Emergency Medicine

## 2020-03-21 ENCOUNTER — Emergency Department (HOSPITAL_COMMUNITY): Payer: Self-pay

## 2020-03-21 ENCOUNTER — Emergency Department (HOSPITAL_COMMUNITY)
Admission: EM | Admit: 2020-03-21 | Discharge: 2020-03-21 | Disposition: A | Discharge status: Home / Self Care | Payer: Self-pay | Attending: Emergency Medicine | Admitting: Emergency Medicine

## 2020-03-21 ENCOUNTER — Other Ambulatory Visit: Payer: Self-pay

## 2020-03-21 DIAGNOSIS — G43809 Other migraine, not intractable, without status migrainosus: Secondary | ICD-10-CM

## 2020-03-21 DIAGNOSIS — H538 Other visual disturbances: Secondary | ICD-10-CM | POA: Insufficient documentation

## 2020-03-21 DIAGNOSIS — R202 Paresthesia of skin: Secondary | ICD-10-CM | POA: Insufficient documentation

## 2020-03-21 DIAGNOSIS — R42 Dizziness and giddiness: Secondary | ICD-10-CM | POA: Insufficient documentation

## 2020-03-21 DIAGNOSIS — R519 Headache, unspecified: Secondary | ICD-10-CM | POA: Insufficient documentation

## 2020-03-21 LAB — CBC
HCT: 38 % (ref 36.0–46.0)
Hemoglobin: 12.1 g/dL (ref 12.0–15.0)
MCH: 27.8 pg (ref 26.0–34.0)
MCHC: 31.8 g/dL (ref 30.0–36.0)
MCV: 87.4 fL (ref 80.0–100.0)
Platelets: 402 10*3/uL — ABNORMAL HIGH (ref 150–400)
RBC: 4.35 MIL/uL (ref 3.87–5.11)
RDW: 14.1 % (ref 11.5–15.5)
WBC: 6.8 10*3/uL (ref 4.0–10.5)
nRBC: 0 % (ref 0.0–0.2)

## 2020-03-21 LAB — BASIC METABOLIC PANEL
Anion gap: 10 (ref 5–15)
BUN: 11 mg/dL (ref 6–20)
CO2: 25 mmol/L (ref 22–32)
Calcium: 9.3 mg/dL (ref 8.9–10.3)
Chloride: 106 mmol/L (ref 98–111)
Creatinine, Ser: 0.58 mg/dL (ref 0.44–1.00)
GFR, Estimated: >60 mL/min (ref >60)
Glucose, Bld: 128 mg/dL — ABNORMAL HIGH (ref 70–99)
Potassium: 3.3 mmol/L — ABNORMAL LOW (ref 3.5–5.1)
Sodium: 141 mmol/L (ref 135–145)

## 2020-03-21 LAB — TROPONIN I (HIGH SENSITIVITY)
Troponin I (High Sensitivity): <2 ng/L (ref <18)
Troponin I (High Sensitivity): <2 ng/L (ref <18)

## 2020-03-21 LAB — I-STAT BETA HCG BLOOD, ED (MC, WL, AP ONLY): I-stat hCG, quantitative: <5 m[IU]/mL (ref <5)

## 2020-03-21 LAB — CBG MONITORING, ED: Glucose-Capillary: 129 mg/dL — ABNORMAL HIGH (ref 70–99)

## 2020-03-21 MED ORDER — GADOBUTROL 1 MMOL/ML IV SOLN
7.5000 mL | Freq: Once | INTRAVENOUS | Status: AC | PRN
  Administered 2020-03-21: 7.5 mL via INTRAVENOUS

## 2020-03-21 MED ORDER — OXYCODONE-ACETAMINOPHEN 5-325 MG PO TABS
2.0000 | ORAL_TABLET | Freq: Once | ORAL | Status: AC
  Administered 2020-03-21: 2 via ORAL
  Filled 2020-03-21: qty 2

## 2020-03-21 NOTE — ED Triage Notes (Signed)
  The pain and numbness is on the rt. Side.

## 2020-03-21 NOTE — Discharge Instructions (Addendum)
Please return if you are worse at any time.  Follow up outpatient if any symptoms continue

## 2020-03-21 NOTE — ED Provider Notes (Signed)
[facility] EMERGENCY DEPARTMENT Provider Note   CSN: [id number] Arrival date & time: 03/21/20  1041     History Chief Complaint  Patient presents with  . Numbness  . Headache  . Chest Pain    Natalie Landry is a 39 y.o. female.  HPI  39 year old female presents today complaining of 4 to 5 days of right-sided paresthesias and headache.  She states it began with numbness from her nose over on the right side.  She then began having some headache which she describes as tingling and some pressure.  Pain has waxed and waned 5 "10.  She has taken Aleve with some minimal relief.  She has some blurriness of vision in her right eyelid denies any diplopia or other visual problems.  She denies any weakness although she states she has had some dizziness and has had some difficulty with her gait during this time.  Scribes the dizziness as having her gait off balance.  She denies any significant similar symptoms or history of headache in the past.     History reviewed. No pertinent past medical history.  There are no problems to display for this patient.   Past Surgical History:  Procedure Laterality Date  . CESAREAN SECTION    . CHOLECYSTECTOMY       OB History   No obstetric history on file.     History reviewed. No pertinent family history.  Social History   Tobacco Use  . Smoking status: Never Smoker  . Smokeless tobacco: Never Used  Substance Use Topics  . Alcohol use: No  . Drug use: No    Home Medications Prior to Admission medications   Medication Sig Start Date End Date Taking? Authorizing Provider  HYDROcodone-acetaminophen (NORCO/VICODIN) 5-325 MG per tablet Take 1-2 tablets by mouth every 6 (six) hours as needed. 10/27/14   Roxy Horseman, PA-C  naproxen (NAPROSYN) 500 MG tablet Take 1 tablet (500 mg total) by mouth 2 (two) times daily with a meal. 07/16/13   Eber Hong, MD  OVER THE COUNTER MEDICATION Take 2 tablets by mouth daily as needed  (headache).    [provider]  traMADol (ULTRAM) 50 MG tablet Take 1 tablet (50 mg total) by mouth every 6 (six) hours as needed. 07/16/13   Eber Hong, MD    Allergies    Patient has no known allergies.  Review of Systems   Review of Systems  Constitutional: Negative.   HENT: Negative.   Eyes: Positive for visual disturbance.  Respiratory: Negative.   Cardiovascular: Negative.   Gastrointestinal: Negative.   Endocrine: Negative.   Genitourinary: Positive for menstrual problem.       She states her menstrual cycles are given length and sometimes have quite heavy bleeding  Musculoskeletal: Positive for gait problem and myalgias.  Skin: Negative.   Hematological: Negative.   Psychiatric/Behavioral: Negative.   All other systems reviewed and are negative.   Physical Exam Updated Vital Signs BP 119/83 (BP Location: Right Arm)   Pulse 76   Temp 98.1 F (36.7 C) (Oral)   Resp 16   LMP 03/11/2020   SpO2 100%   Physical Exam  ED Results / Procedures / Treatments   Labs (all labs ordered are listed, but only abnormal results are displayed) Labs Reviewed  BASIC METABOLIC PANEL - Abnormal; Notable for the following components:      Result Value   Potassium 3.3 (*)    Glucose, Bld 128 (*)    All  other components within normal limits  CBC - Abnormal; Notable for the following components:   Platelets 402 (*)    All other components within normal limits  CBG MONITORING, ED - Abnormal; Notable for the following components:   Glucose-Capillary 129 (*)    All other components within normal limits  I-STAT BETA HCG BLOOD, ED ([facility], [facility], [facility] ONLY)  TROPONIN I (HIGH SENSITIVITY)    EKG EKG Interpretation  Date/Time:  Saturday March 21 2020 10:48:00 EDT Ventricular Rate:  75 PR Interval:  208 QRS Duration: 78 QT Interval:  410 QTC Calculation: 457 R Axis:   43 Text Interpretation: Normal sinus rhythm Normal ECG Confirmed by Margarita Grizzle [phone]) on 03/21/2020  11:57:38 AM   Radiology DG Chest 2 View  Result Date: 03/21/2020 CLINICAL DATA:  Chest pain EXAM: CHEST - 2 VIEW COMPARISON:  None. FINDINGS: The heart size and mediastinal contours are within normal limits. Both lungs are clear. The visualized skeletal structures are unremarkable. IMPRESSION: No active cardiopulmonary disease. Electronically Signed   By: Romona Curls M.D.   On: 03/21/2020 11:23    Procedures Procedures (including critical care time)  Medications Ordered in ED Medications - No data to display  ED Course  I have reviewed the triage vital signs and the nursing notes.  Pertinent labs & imaging results that were available during my care of the patient were reviewed by me and considered in my medical decision making (see chart for details). MRI brain and neck normal Discussed results with patient and    MDM Rules/Calculators/A&P                          Final Clinical Impression(s) / ED Diagnoses Final diagnoses:  None    Rx / DC Orders ED Discharge Orders    None       Margarita Grizzle, MD 03/24/20 1312

## 2020-03-21 NOTE — ED Triage Notes (Signed)
Pt/translator stated, I started having numbness, headache 3-4 days ago. No weakness in arms, symmetrical smile. Alert and oriented x 4.

## 2020-12-09 ENCOUNTER — Emergency Department (HOSPITAL_COMMUNITY)
Admission: EM | Admit: 2020-12-09 | Discharge: 2020-12-09 | Disposition: A | Discharge status: Home / Self Care | Payer: Self-pay

## 2020-12-09 ENCOUNTER — Other Ambulatory Visit: Payer: Self-pay

## 2023-01-24 ENCOUNTER — Encounter (HOSPITAL_COMMUNITY): Payer: Self-pay

## 2023-01-24 ENCOUNTER — Emergency Department (HOSPITAL_COMMUNITY)
Admission: EM | Admit: 2023-01-24 | Discharge: 2023-01-25 | Discharge status: Against Medical Advice | Payer: Self-pay | Attending: Emergency Medicine | Admitting: Emergency Medicine

## 2023-01-24 DIAGNOSIS — Z5321 Procedure and treatment not carried out due to patient leaving prior to being seen by health care provider: Secondary | ICD-10-CM | POA: Insufficient documentation

## 2023-01-24 DIAGNOSIS — R21 Rash and other nonspecific skin eruption: Secondary | ICD-10-CM | POA: Insufficient documentation

## 2023-01-24 MED ORDER — DIPHENHYDRAMINE HCL 50 MG/ML IJ SOLN
25.0000 mg | Freq: Once | INTRAMUSCULAR | Status: AC
  Administered 2023-01-24: 25 mg via INTRAVENOUS
  Filled 2023-01-24: qty 1

## 2023-01-24 NOTE — ED Triage Notes (Signed)
Pt presents with rash on her hands and arms that extend upwards to her face. He left eye does appear swollen and she appears to have the rash on her face as well. There is no airway compromise at this time and this has been a progressing issue for 4 days. She felt the need to come in for the rash/reaction due to it involving her face now. She has no difficulty breathing and no chest pain at this moment. Pt is otherwise stable and is cooperating with the triage process.

## 2023-01-26 ENCOUNTER — Other Ambulatory Visit: Payer: Self-pay

## 2023-01-26 ENCOUNTER — Encounter (HOSPITAL_COMMUNITY): Payer: Self-pay | Admitting: Emergency Medicine

## 2023-01-26 ENCOUNTER — Emergency Department (HOSPITAL_COMMUNITY)
Admission: EM | Admit: 2023-01-26 | Discharge: 2023-01-26 | Disposition: A | Discharge status: Home / Self Care | Payer: Self-pay | Attending: Emergency Medicine | Admitting: Emergency Medicine

## 2023-01-26 DIAGNOSIS — L259 Unspecified contact dermatitis, unspecified cause: Secondary | ICD-10-CM | POA: Insufficient documentation

## 2023-01-26 MED ORDER — HYDROCORTISONE 2.5 % EX LOTN: TOPICAL_LOTION | Freq: Two times a day (BID) | CUTANEOUS | 0 refills | Status: AC

## 2023-01-26 MED ORDER — PREDNISONE 50 MG PO TABS
60.0000 mg | ORAL_TABLET | Freq: Once | ORAL | Status: AC
  Administered 2023-01-26: 60 mg via ORAL
  Filled 2023-01-26: qty 1

## 2023-01-26 MED ORDER — PREDNISONE 10 MG PO TABS: ORAL_TABLET | ORAL | 0 refills | Status: AC

## 2023-01-26 NOTE — ED Triage Notes (Signed)
Pt reports continued rash that is on her right arm and right eye. Pt reports the swelling in here eyes are better. Pt reports she has been taking benadryl with no relief.

## 2023-01-26 NOTE — ED Provider Notes (Signed)
[place] EMERGENCY DEPARTMENT AT [facility] Provider Note   CSN: [id number] Arrival date & time: 01/26/23  1310     History Chief Complaint  Patient presents with   Rash    HPI Natalie Landry is a 42 y.o. female presenting for rash. The patient presents with a chief complaint of a vesicular rash that has been present for a week and a day. The patient reports no known cause for the rash but mentions spending time both inside and outside the house, with possible contact with plants. The rash is described as extensive and appears consistent with exposure to poison ivy or poison oak.  The patient denies any other medical problems or pain in the abdomen. The patient is able to feel the provider's touch during the examination. Patient's recorded medical, surgical, social, medication list and allergies were reviewed in the Snapshot window as part of the initial history.   Review of Systems   Review of Systems  Physical Exam Updated Vital Signs BP 105/75 (BP Location: Right Arm)   Pulse 77   Temp 98.3 F (36.8 C) (Oral)   Resp 18   SpO2 100%  Physical Exam Vitals and nursing note reviewed.  Constitutional:      General: She is not in acute distress.    Appearance: She is well-developed.  HENT:     Head: Normocephalic and atraumatic.  Eyes:     Conjunctiva/sclera: Conjunctivae normal.  Cardiovascular:     Rate and Rhythm: Normal rate and regular rhythm.     Heart sounds: No murmur heard. Pulmonary:     Effort: Pulmonary effort is normal. No respiratory distress.     Breath sounds: Normal breath sounds.  Abdominal:     General: There is no distension.     Palpations: Abdomen is soft.     Tenderness: There is no abdominal tenderness. There is no right CVA tenderness or left CVA tenderness.  Musculoskeletal:        General: No swelling or tenderness. Normal range of motion.     Cervical back: Neck supple.     Comments: Presence of a vesicular rash, suggestive of  contact dermatitis possibly due to poison ivy or poison oak exposure.  Extends up both forearms to the level of the deltoids   Skin:    General: Skin is warm and dry.  Neurological:     General: No focal deficit present.     Mental Status: She is alert and oriented to person, place, and time. Mental status is at baseline.     Cranial Nerves: No cranial nerve deficit.      ED Course/ Medical Decision Making/ A&P    Procedures Procedures   Medications Ordered in ED Medications  predniSONE (DELTASONE) tablet 60 mg (has no administration in time range)    Medical Decision Making:   1. Vesicular rash - likely secondary to poison ivy or poison oak exposure    - Prescribe topical steroid cream for itching relief    - Prescribe oral steroids for two weeks to reduce inflammation and expedite healing    - Administer first dose in the clinic if possible    - Educate patient on avoiding contact with potential allergens and proper skin care during treatment    - Encourage patient to follow up if rash worsens or does not improve within the expected timeframe  Disposition:  I have considered need for hospitalization, however, considering all of the above, I believe this patient is  stable for discharge at this time.  Patient/family educated about specific return precautions for given chief complaint and symptoms.  Patient/family educated about follow-up with PCP .     Patient/family expressed understanding of return precautions and need for follow-up. Patient spoken to regarding all imaging and laboratory results and appropriate follow up for these results. All education provided in verbal form with additional information in written form. Time was allowed for answering of patient questions. Patient discharged.    Emergency Department Medication Summary:   Medications  predniSONE (DELTASONE) tablet 60 mg (has no administration in time range)         Clinical Impression:  1. Contact  dermatitis, unspecified contact dermatitis type, unspecified trigger      Discharge   Final Clinical Impression(s) / ED Diagnoses Final diagnoses:  Contact dermatitis, unspecified contact dermatitis type, unspecified trigger    Rx / DC Orders ED Discharge Orders          Ordered    predniSONE (DELTASONE) 10 MG tablet  Daily        01/26/23 1636    hydrocortisone 2.5 % lotion  2 times daily        01/26/23 1636              Glyn Ade, MD 01/26/23 1636

## 2024-05-20 ENCOUNTER — Emergency Department (HOSPITAL_COMMUNITY)
Admission: EM | Admit: 2024-05-20 | Discharge: 2024-05-20 | Discharge status: Against Medical Advice | Attending: Emergency Medicine | Admitting: Emergency Medicine

## 2024-05-20 ENCOUNTER — Emergency Department (HOSPITAL_COMMUNITY)

## 2024-05-20 DIAGNOSIS — R079 Chest pain, unspecified: Secondary | ICD-10-CM | POA: Insufficient documentation

## 2024-05-20 DIAGNOSIS — R202 Paresthesia of skin: Secondary | ICD-10-CM | POA: Diagnosis not present

## 2024-05-20 DIAGNOSIS — R0602 Shortness of breath: Secondary | ICD-10-CM | POA: Diagnosis not present

## 2024-05-20 DIAGNOSIS — Z5321 Procedure and treatment not carried out due to patient leaving prior to being seen by health care provider: Secondary | ICD-10-CM | POA: Diagnosis not present

## 2024-05-20 LAB — CBC
HCT: 38.6 % (ref 36.0–46.0)
Hemoglobin: 12.5 g/dL (ref 12.0–15.0)
MCH: 26.1 pg (ref 26.0–34.0)
MCHC: 32.4 g/dL (ref 30.0–36.0)
MCV: 80.6 fL (ref 80.0–100.0)
Platelets: 380 K/uL (ref 150–400)
RBC: 4.79 MIL/uL (ref 3.87–5.11)
RDW: 14.8 % (ref 11.5–15.5)
WBC: 8.3 K/uL (ref 4.0–10.5)
nRBC: 0 % (ref 0.0–0.2)

## 2024-05-20 LAB — BASIC METABOLIC PANEL WITH GFR
Anion gap: 12 (ref 5–15)
BUN: 9 mg/dL (ref 6–20)
CO2: 24 mmol/L (ref 22–32)
Calcium: 9.7 mg/dL (ref 8.9–10.3)
Chloride: 98 mmol/L (ref 98–111)
Creatinine, Ser: 0.51 mg/dL (ref 0.44–1.00)
GFR, Estimated: >60 mL/min
Glucose, Bld: 336 mg/dL — ABNORMAL HIGH (ref 70–99)
Potassium: 4 mmol/L (ref 3.5–5.1)
Sodium: 133 mmol/L — ABNORMAL LOW (ref 135–145)

## 2024-05-20 LAB — TROPONIN T, HIGH SENSITIVITY: Troponin T High Sensitivity: <15 ng/L (ref 0–19)

## 2024-05-20 LAB — HCG, SERUM, QUALITATIVE: Preg, Serum: NEGATIVE

## 2024-05-20 NOTE — ED Triage Notes (Signed)
 BIB EMS from home with chest pain started today. Sharp radiates to right arm. Does not have PCP and no known history   EMS vitals 324 asa .4 nitroglycerin 0/10 pain at time of arrival   112/72 91 hr 98% ra  18 LAC

## 2024-05-20 NOTE — ED Notes (Signed)
 Pt called x2 for trop no answer at rn alaina aware

## 2024-05-20 NOTE — ED Notes (Signed)
 Pt decided to leave AMA NT removed IV

## 2024-05-20 NOTE — ED Provider Triage Note (Addendum)
 Emergency Medicine Provider Triage Evaluation Note  Ma Jaleigha Deane , a 43 y.o. female was evaluated in triage.  Pt complains of chest pain and shortness of breath. Given aspirin and nitro by ems that helped. Substernal tells me it does not radiate. Does have arm tingling. Has been stressed. Pain has resolved. No cardiac hx. No hx of DVT/PE. Not on hormones. No surgery in the past month. No cancer. No hemoptysis.   Review of Systems  Positive: SOB, CP Negative:   Physical Exam  BP 124/85 (BP Location: Left Arm)   Pulse 79   Temp 98.8 F (37.1 C) (Oral)   Resp 14   SpO2 100%  Gen:   Awake, no distress   Resp:  Normal effort  MSK:   Moves extremities without difficulty  Other:    Medical Decision Making  Medically screening exam initiated at 3:39 PM.  Appropriate orders placed.  Ma Daisey Caloca was informed that the remainder of the evaluation will be completed by another provider, this initial triage assessment does not replace that evaluation, and the importance of remaining in the ED until their evaluation is complete.  Hx obtained via spanish interpreter   Yolande Lamar BROCKS, MD 05/20/24 1541    Yolande Lamar BROCKS, MD 05/20/24 [phone]
